# Patient Record
Sex: Female | Born: 1945
Health system: Southern US, Academic
[De-identification: ages and names within clinical notes are randomized; demographics above are authoritative.]

---

## 2008-07-27 ENCOUNTER — Ambulatory Visit (HOSPITAL_COMMUNITY): Payer: Self-pay

## 2010-11-10 ENCOUNTER — Other Ambulatory Visit (HOSPITAL_COMMUNITY): Payer: Self-pay

## 2018-09-27 ENCOUNTER — Encounter (HOSPITAL_BASED_OUTPATIENT_CLINIC_OR_DEPARTMENT_OTHER): Payer: Self-pay | Admitting: PSYCHOLOGIST-CLINICAL

## 2018-09-27 NOTE — Nursing Note (Signed)
FORWARDED REFERRAL TO [facility]

## 2018-10-03 ENCOUNTER — Encounter (HOSPITAL_BASED_OUTPATIENT_CLINIC_OR_DEPARTMENT_OTHER): Payer: Self-pay | Admitting: PSYCHOLOGIST-CLINICAL

## 2018-10-03 NOTE — Nursing Note (Signed)
RE: NP REFERRAL   Received: 4 days ago   Message Contents   Dipino, Marcy Salvo, PhD  [place] Revonda Standard), Lenny Pastel; [facility] New Patient Scheduling; Maximino Greenland be billed for the [phone] combination.   Thanks!   Selena Batten

## 2018-10-13 ENCOUNTER — Encounter (HOSPITAL_BASED_OUTPATIENT_CLINIC_OR_DEPARTMENT_OTHER): Payer: Self-pay | Admitting: PSYCHOLOGIST-CLINICAL

## 2018-11-01 ENCOUNTER — Ambulatory Visit (HOSPITAL_BASED_OUTPATIENT_CLINIC_OR_DEPARTMENT_OTHER): Payer: Self-pay | Admitting: PSYCHOLOGIST-CLINICAL

## 2018-11-15 ENCOUNTER — Ambulatory Visit (INDEPENDENT_AMBULATORY_CARE_PROVIDER_SITE_OTHER): Payer: No Typology Code available for payment source | Admitting: PSYCHOLOGIST-CLINICAL

## 2018-11-15 ENCOUNTER — Other Ambulatory Visit: Payer: Self-pay

## 2018-11-15 VITALS — Temp 97.3°F

## 2018-11-15 DIAGNOSIS — G3184 Mild cognitive impairment, so stated: Secondary | ICD-10-CM

## 2018-12-05 NOTE — Progress Notes (Signed)
NEUROPSYCHOLOGICAL EVALUATION            [facility]     NAME:  Rachel Moore      MRN:  [id number]  DATE OF BIRTH: 1945-12-12      DATE OF EVALUATION: 11/15/2018    Assessment Procedures:  Clinical Interview 8:15-8:45 * Rey Complex Figure Copy 8:45-8:50 * Wide Range Achievement Test-5 (WRAT-5) Reading sub-test 8:50-8:52 * Reciprocal Motor Movements/Go No-go 8:52-8:53 * Rey Complex Figure Immediate Recall 8:53-8:55 * Temporal Orientation 8:55-8:56 * Logical Memory I 8:56-9:03 *Verbal Trails Making Test 9:03-9:06 * Digit Span 9:06-9:10 * Arithmetic 9:10-9:15 * Logical Memory II 9:15-9:20 * Rey Complex Figure Delayed Recall 9:20-9:24 *California Verbal Learning Test - II (CVLT-II) 9:24-9:30 * Digit Symbol 9:32-9:37 * 2-and 3-D Drawings 9:37-9:40 * Complex Ideational Material 9:40-19:45 * CVLT-II Recall 9:45-9:52 * Information 9:52-10:00 * CVLT-II Forced Choice 10:00-10:05 * NCSE Repetition 10:05-10:06 * NCSE Naming 10:06-10:08 * Controlled Oral Word Association Test 10:08-10:12 * Animal Naming 10:12-10:15 * Vocabulary 10:15-10:25 * Similarities 10:25-10:35 * Personality Assessment Inventory (PAI) 10:38-11:30.  She was seen for 3.5 hours of test administration and 3 hours were involved in record review, decision making, test scoring and interpretation, and report preparation.      Rachel Moore was seen in the Department of Behavioral Medicine via an audio-visual telehealth conference between two offices.  It should be noted that changes in standardized test administration related to the telemedicine format could potentially have an impact on the established reliability and validity of the measures. The following results should be viewed with this in mind.       Psychosocial History:    Age:  [age] Sex: F   Handedness: R  Years Educ.: [id number]  Ethnicity: Caucasian  Marital Status:  W     Employment Status: Retired    Occupation: LPN    Reason for Referral:  Rachel Moore was seen for a neuropsychological  assessment on 11/15/2018 at the request of Dr. Gery PrayBarry Vaught to evaluate reports of memory loss.  Rachel Moore stated her memory loss started about a year ago and she was not sure if it has progressed.  She stated she forgets dates, other pertinent information and things she knew in the past.  She stated she forgets where she puts things and she describe an incident in which she [facility], set it down, and lost it for 2 months.  She stated she now has to tell herself where she's putting things, often double checks things to make sure and often repeats questions.  She stated her memory became worse after falling in her husband's hospital room on 11/15/2017.  She reported she was visiting her husband, tripped on a cord, and fell. She reportedly hit the hit wall and the floor and she broke her left hip.  There was no reported LOC in the incident.  She underwent surgery on her hip on 6/10 and 6/11 and she was in rehabilitation from August to October 2019.  Medical records report recent MRI of her brain indicted some white matter disease but nothing acute.  AN EEG reportedly showed cognitive dysfunction but was negative for epilepsy.  She scored 24/30 on a MOCA administered on 07/04/2018.      Relevant Medical History:  Medical history is positive for a TIA in 1994.  She has HTN, hypercholesteremia, hypothyroidism, Alport's syndrome and NIDDM. She has arthritis and osteoporosis.  She has a hearing aid for her left ear.  Medical  records report her surgical history is also positive for a cholecystectomy and tubal ligation.  Her PCP is Dr. Ardelia Memshonda Guy.    Current Medications: Medical records reported she is taking Vitamin B12 3000 mcg/ml sublingual, Atenolol 25 mg daily, Raloxifene HCl 60 mg daily, Losartan Potassium 50 g daily, Duloxetine HCl Delayed Release 30 mg daily, Aspirin 325 mg daily, Metformin HCl ER 500 mg 2 tabs daily Omeprazole 40 mg Delayed Release daily.     Psychiatric History:  Medical records  report a history of depression.  There is no history of formal psychiatric treatment.    Substance Related History:  History of substance use is reportedly negative.    Psychosocial History: Rachel Moore [place] from [place].  She completed high school and was married right after school.  She raised 2 children, a son, currently [id number] and a daughter age 71.  She subsequently returned to school and earned her LPN degree from [employer] in [place].  She worked as an Public house managerLPN until retiring in 1997.  She was married for 52 years.  Her husband died a couple of weeks prior to the current appointment.  She lives with her daughter in [place], [place].          Mental Status/Behavioral Observations: Rachel Moore arrived early for testing and was unaccompanied, having been dropped off at the session by her daughter.  Her appearance was neatly dressed and appropriately groomed.  Her behavior was normal.  She interacted well with the examiner and appeared to form good rapport.  Speech was normal.  She engaged in spontaneous conversation and maintained good eye contact.  Thought processes were intact.  Thought content was logical and goal directed. Judgment and insight appeared intact and she verbalized an understanding of the reason for the evaluation. Aural comprehension appeared intact and she had no apparent difficulty understanding verbal or written instructions.  Posture and gait were unremarkable and there were no unusual movements or tremors noted.  Mood and affect were appropriate to the testing setting.  Suicidal and homicidal ideation or intent were denied.  She engaged well in the assessment process.  Her attitude towards testing was cooperative and she readily attempted all tasks. Her working pace was good and she appeared to put forth good consistent effort over the course of the testing.    EXAMINATION RESULTS  Level of Consciousness/Orientation: Rachel Moore was alert throughout the evaluation.  She was aware  of the correct month, date, year, day of the week, and time of day.  She was aware of her location.      Attn/Concentration:     Immediate Memory Span:  Immediate verbal span was inconsistent.  She correctly repeated 7 digits forward, which is in the normal range.  On the initial trial of a 16 word list learning task, she correctly repeated 3 words, which is below expectation.    Working Memory:  Working Civil Service fast streamermemory was mildly inconsistent.  She correctly repeated 4 digits backwards, which is in the normal range.  On a measure of attention requiring her to mentally sequence a set of verbally presented numbers, she correctly repeated 4 digits, which is within normal limits.  On a task requiring her to mentally solve verbally presented arithmetic problems she obtained a score at the lower end of the average range, at the 9th percentile compared to others her age.  On a coding task requiring speed, accuracy, and attention, she obtained a score within normal limits, at the 50th percentile  compared to others her age.    Cognitive Flexibility:  She made one error on a task requiring her to verbally shift and maintain cognitive sets and obtained a score in the normal range, at the 42nd percentile compared to others her age.  She made no errors on a reciprocal motor movement task and was able to inhibit a newly learned behavior.    Learning / Memory:    Acquisition of New Information:  List acquisition was non-progressive and within normal limits, at the  23rd percentile compared to others her age.  As stated, she recalled 3 of 16 words on the initial learning trial.  She recalled 8 words on the second learning trial, 5 words on trial three, and 9 words on each of the remaining two learning trials.  She made use of organizing strategies such as clustering to assist with recall and she exhibited a strong primacy effect on the task.  Recall consistency across trials (52 %) was below expectation.  Acquisition of narrative  information presented in the context of two brief stories was progressive and below expectation, at the 2nd percentile compared to others her age.  Acquisition of a complex visual design was in the normal range, above the 16th percentile compared to others her age.   Retention:  List retention following a brief delay and the presentation of a second word list was below expectation, with 4 words recalled.  Providing category prompts improved recall to 10 words.  Retention following a 20 minute delay was in the normal range, with 6 words recalled in a free recall condition and 8 words recalled in response to category prompts.  Retention of narrative information was below expectation, at the 1st percentile compared to her age and education mates with 15.4 percent of acquired information retained after a 20 minute delay.  Retention of the complex design was below expectation, at the 7th percentile compared to others her age after a brief delay.  Retention following a 20 minute delay was below expectation at the 4th percentile.         Recognition:  List recognition was better than recall and within normal limits, with 15 words recognized and 8 false positive errors.    Language:     Fluency:  Expository speech was fluent, grammatic, and prosodic.  Phrase length was adequate. There were no paraphasic errors noted.    Confrontation Naming:  Her performance of a brief visual naming task was in the average range.    Word Production:  Verbal fluency was at the lower end of the average range for phonemic categories (14th %ile) and below expectation for semantic categories (3rd %ile) compared to her age and education mates.    Repetition: Repetition of basic phrases was in the average range.     Comprehension:  On a task requiring aural comprehension she obtained a score below expectation, below the 1st percentile compared to her age and education mates.   Intellectual Functioning: On the WRAT-5 Reading sub-test, which  provides an estimate of baseline level of functioning, she obtained a score above the grade 12.9 level and at the 75th percentile compared to others her age.  Her knowledge of word meanings and usage was in the normal range, at the 50th percentile compared to others her age.  On a measure of abstract reasoning requiring her to verbalize similarities between two objects, she obtained a score within normal limits, at the 25th percentile.  Her general range of knowledge was in the  normal range, at the 63rd percentile compared to others her age.   Spatial Analysis / Synthesis:     Drawings to Copy:  Her copies of 2- and 3-D figures were generally well done and not suggestive of the presence of constructional difficulties.    Block Construction: She obtained a score    at the  percentile compared to others her age on a task requiring her to analyze and construct abstract designs.    Motor:  Motor functions were not formally assessed.  However, on observation, posture and gait were unremarkable.  There were no unusual movements or tremors noted.       Psychological & Behavioral Considerations:   Validity scales of the PSI indicated that Ms. Garling attended appropriately in responding to item conten.t  However, there may have been some idiosyncratic responses to particular items that could affect the test results.  There were also indications that she tended to portray herself as being relatively free of common shortcomings to which most people will admit and she may have been reluctant to admit to minor faults in herself.  Although there is no evidence to suggest a deliberate attempt to distort the clinical picture, the clinical scales may underrepresent the nature and extent of any psychiatric distress she may be experiencing.  With this in mind, the clinical scales of the PAI were in the normal range and not suggestive of the presence of elevated levels of psychiatric distress.  Ms. Contino descried her self-concept as  involving a generally stable and positive self-evaluation and her responses suggest she is normally a confident and optimistic person who approaches life with a clear sense of purpose and distinct convictions.  She described her interpersonal style as being modest, unpretentious, and retiring.  Individuals with similar responses may be self-conscious in social interactions, uncomfortable in asserting themselves, and may be seen by others as being passive, humble or unassuming.  Ms. Gillin described her recent level of stress and her perceived level of social support as about average in comparison to normal adults.          ASSESSMENT:  Ms. Manalang was alert and oriented throughout the evaluation. She exhibited inconsistencies on measure of basic attention and working memory.  Verbal acquisition and retention were in the normal range for a word list and below expectation for brief stories.  She exhibited a strong primacy effect on the list learning task.  Visual acquisition was within normal limits, and visual retention was below expectation.  Verbal fluency was at the lower end of the average range for phonemic categories (14th %ile) and below expectation for semantic categories (3rd %ile) and aural comprehension was below expectation.  Intellectual functions were intact.  Her copies of 2-and 3-D figures were not suggestive of significant constructional difficulties.  Motor functions were not formally assessed but appeared grossly intact.  Validity scales of the PAI indicated the possibility of some idiosyncratic responses to particular items and a tendency to minimize problems.  Clinical scales of the PAI were in the normal rang and not suggestive of elevated levels of psychiatric distress.      The current results are suggestive of declines in functioning from estimated baseline levels, possibly related to vascular factors.  However, some of her pattern of results suggest the possibility of the early stages of a  DAT. 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  On a day to day basis, Ms. Wnek may benefit from the use of  lists, notes and other reminders.  Repetition of information to ensure it has been heard, understood and encoded may be beneficial.  Considering the primacy effect she exhibited, it may be useful to limit the amount of information she attempts to process at a given time.  Breaking large tasks into smaller components and completing one task at a time may be helpful.  Establishing  and follow set routines and having specific places to keep specific items may also be of assistance.  Re-testing in a year and under more standardized conditions may provide additional useful information and track possible interval changes.      Thank you for this interesting referral and the opportunity to be involved in Ms. Cunning's care.  If questions arise about the test results, please contact Psychology Assessment Service at [phone].        ___________________________________  Nevin Bloodgood Murat Rideout, Ph.D.  Neuropsychologist and Director, [facility]

## 2023-02-03 IMAGING — MR MRI BRAIN W/O CONTRAST
8 of 9 series · 36 of 48 positions shown · IV contrast (gadolinium)
Comparison: None available.
COMPARISON: None available.

------------- REPORT [id number] -------------
﻿EXAM:  MRI BRAIN W/O CONTRAST
INDICATION: 76 year-old with history of transient ischemic episode.  Memory loss.  Dementia.
TECHNIQUE: Multiplanar, multisequential MRI of the brain was performed without gadolinium contrast.

[Series 5: DWI · axial · 5.0mm · 1.35mm/px · z∈[-38,+93]mm · 10 of 95 slices shown (1 of 3)]
[im 7/95]
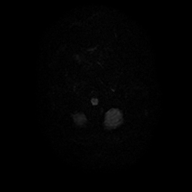
[im 13/95]
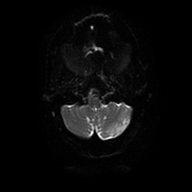
[im 19/95]
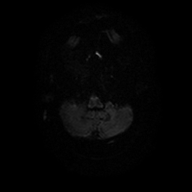
[im 32/95]
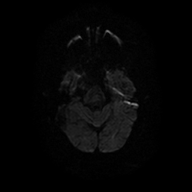
[im 44/95]
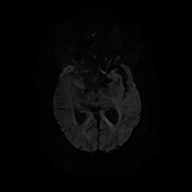
[im 51/95]
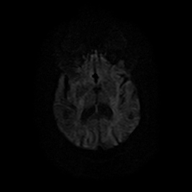
[im 57/95]
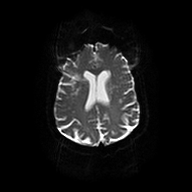
[im 69/95]
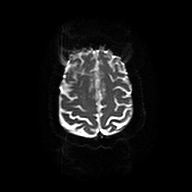
[im 82/95]
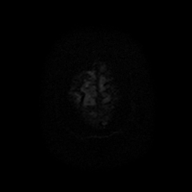
[im 95/95]
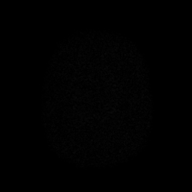

[Series 6: DWI · axial · 5.0mm · 1.35mm/px · z∈[-44,+93]mm · 4 of 24 slices shown (2 of 3)]
[im 1/24]
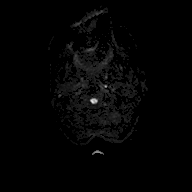
[im 8/24]
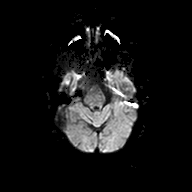
[im 16/24]
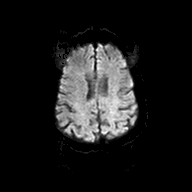
[im 24/24]
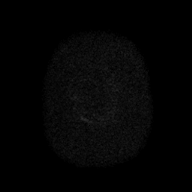

[Series 7: DWI · axial · 5.0mm · 1.35mm/px · z∈[-44,+93]mm · 4 of 23 slices shown (3 of 3)]
[im 1/23]
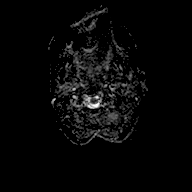
[im 8/23]
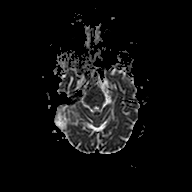
[im 15/23]
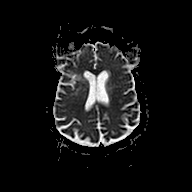
[im 23/23]
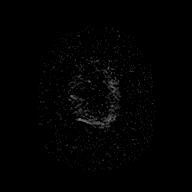

[Series 8: FLAIR · sagittal · 4.0mm · 0.75mm/px · 4 of 27 slices shown (1 of 2)]
[im 1/27]
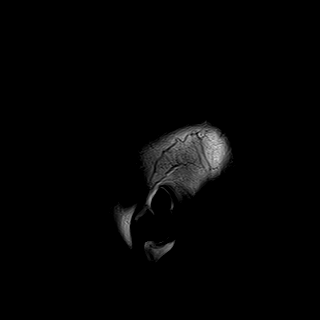
[im 9/27]
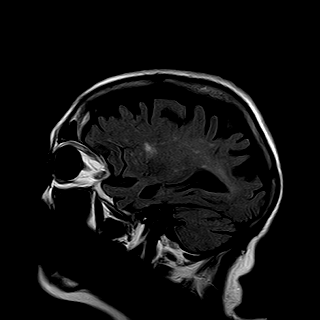
[im 18/27]
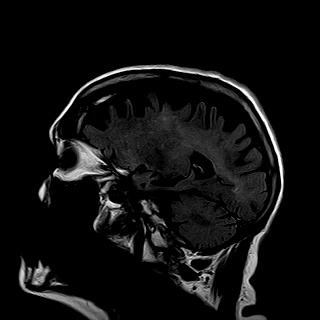
[im 27/27]
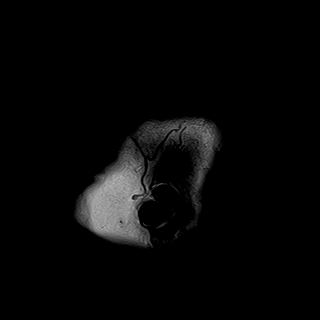

[Series 9: T2 · axial · 5.0mm · 0.43mm/px · z∈[-43,+100]mm · 4 of 25 slices shown (1 of 2)]
[im 1/25]
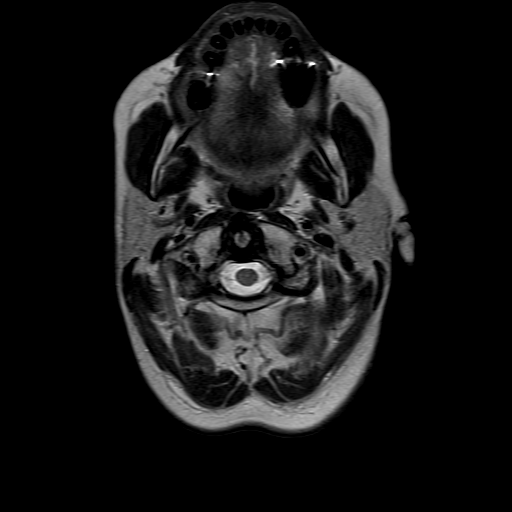
[im 9/25]
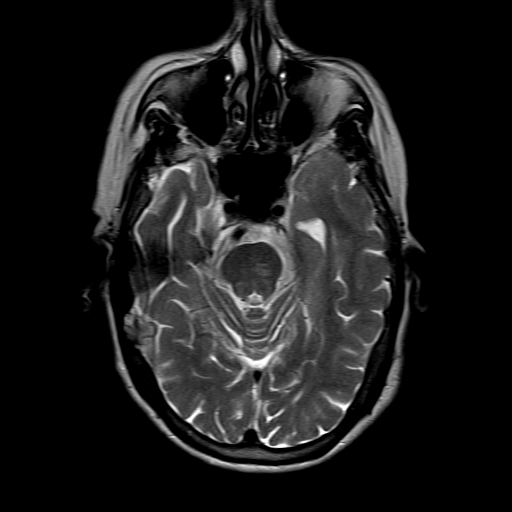
[im 17/25]
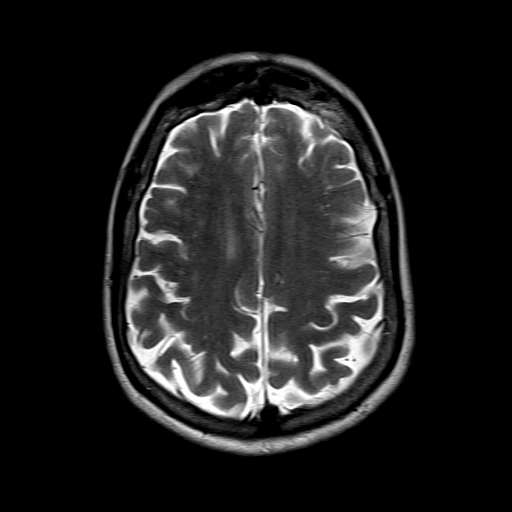
[im 25/25]
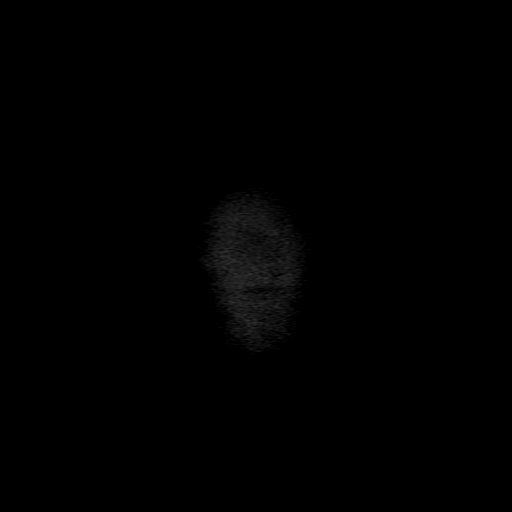

[Series 10: FLAIR · axial · 5.0mm · 0.43mm/px · z∈[-43,+100]mm · 4 of 25 slices shown (2 of 2)]
[im 1/25]
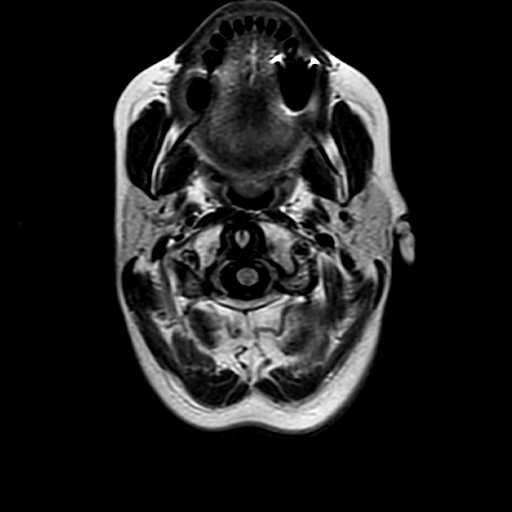
[im 9/25]
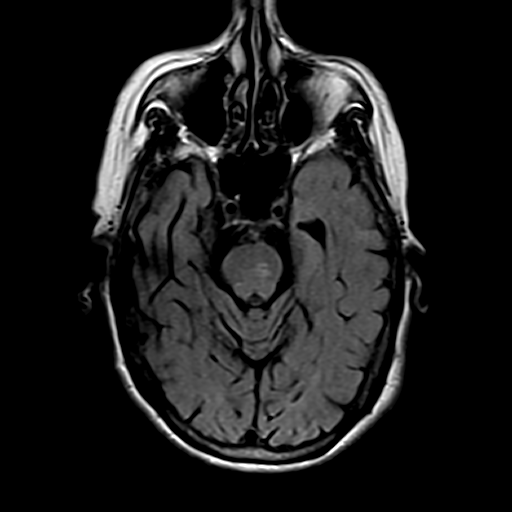
[im 17/25]
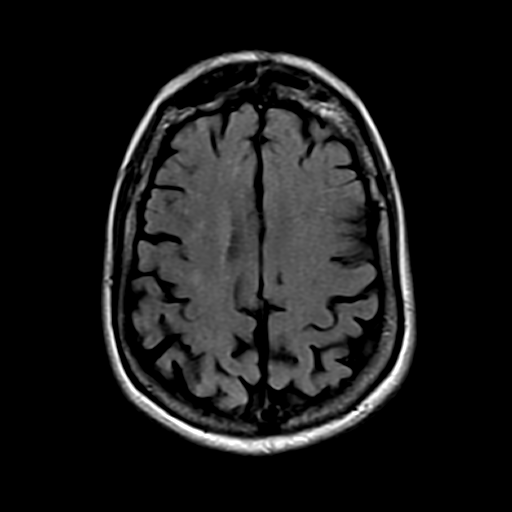
[im 25/25]
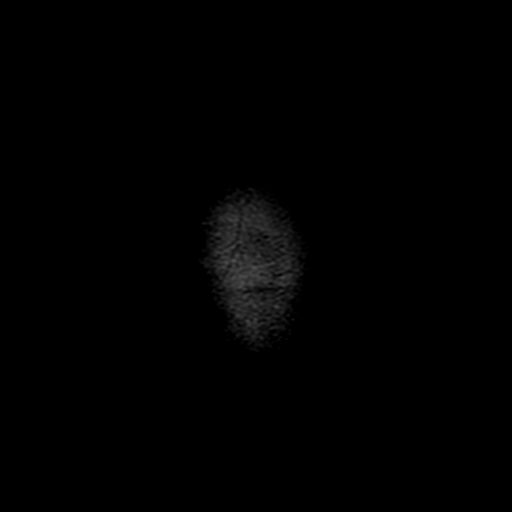

[Series 11: T1 · axial · 5.0mm · 0.43mm/px · z∈[-43,+5]mm · 2 of 25 slices shown]
[im 1/25]
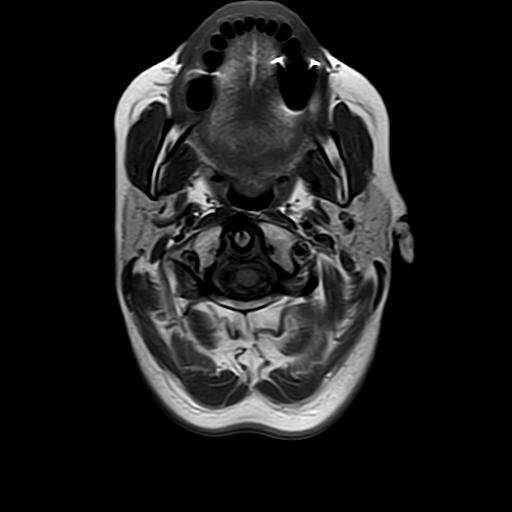
[im 9/25]
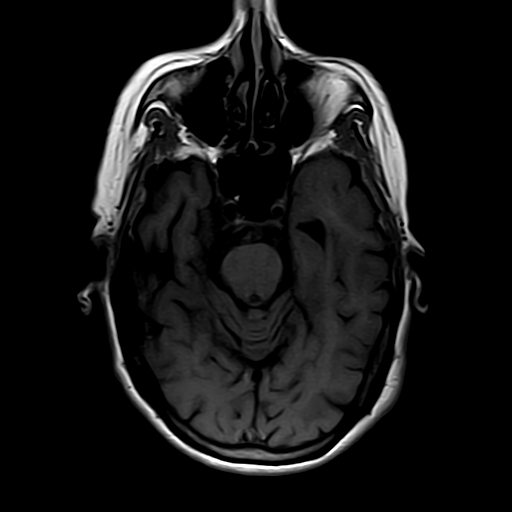

[Series 14: T2 · coronal · 6.0mm · 0.43mm/px · 4 of 26 slices shown (2 of 2)]
[im 1/26]
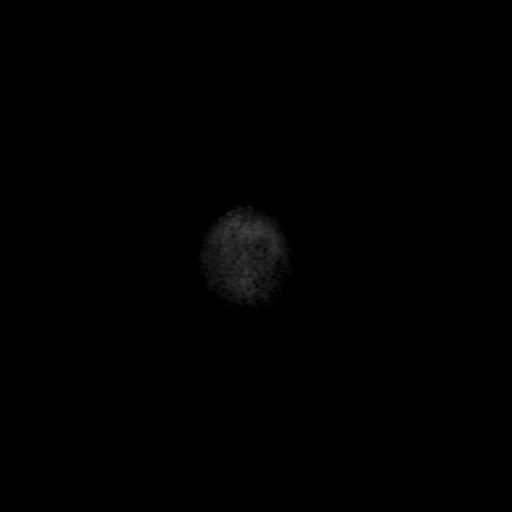
[im 9/26]
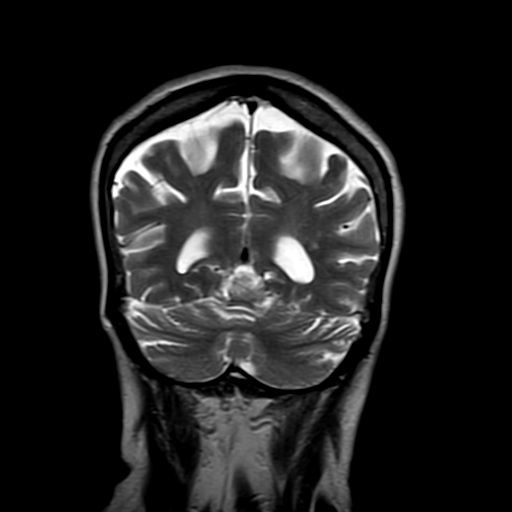
[im 17/26]
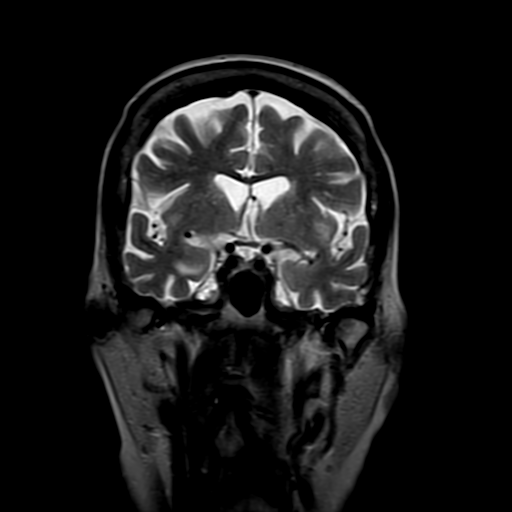
[im 26/26]
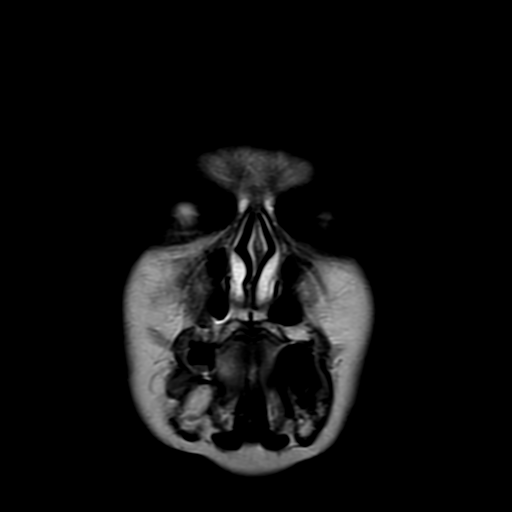

[36 of 48 positions shown; findings below may reference images not displayed]

FINDINGS: No focal areas of restricted diffusion to suggest acute or subacute ischemia.  

No intracranial bleed or extra-axial collections are seen. 

Moderate, symmetric global cerebral cortical atrophy. No evidence of ventriculomegaly or midline shift. Mild chronic small-vessel ischemic change of subcortical and periventricular white matter on both sides.

No space-occupying lesions of the posterior fossa.  Major arteries of circle of Willis and dural venous sinuses are patent.
IMPRESSION: 1. No acute ischemia, intracranial bleed or space-occupying lesions.

2. Moderate, symmetric global cerebral cortical atrophy.  No ventriculomegaly.

3. Mild chronic small-vessel ischemic change of periventricular white matter.

------------- REPORT [id number] -------------
﻿EXAM:  MRI BRAIN W/O CONTRAST
No focal areas of restricted diffusion to suggest acute or subacute ischemia.  

No intracranial bleed or extra-axial collections are seen. 

Moderate, symmetric global cerebral cortical atrophy. No evidence of ventriculomegaly or midline shift. Mild chronic small-vessel ischemic change of subcortical and periventricular white matter on both sides.

No space-occupying lesions of the posterior fossa.  Major arteries of circle of Willis and dural venous sinuses are patent.
IMPRESSION: 1. No acute ischemia, intracranial bleed or space-occupying lesions.

2. Moderate, symmetric global cerebral cortical atrophy.  No ventriculomegaly.

3. Mild chronic small-vessel ischemic change of periventricular white matter.  

Addendum:  Patient is on amyloid modifying treatment.  T2 star gradient echo sequence shows no evidence of micro hemorrhagic lesions or hemoglobin products from bleeding

## 2023-03-04 ENCOUNTER — Ambulatory Visit (INDEPENDENT_AMBULATORY_CARE_PROVIDER_SITE_OTHER): Payer: Self-pay | Admitting: NURSE PRACTITIONER
